# Patient Record
Sex: Female | Born: 1935 | Race: White | Hispanic: No
Health system: Southern US, Community
[De-identification: ages and names within clinical notes are randomized; demographics above are authoritative.]

---

## 2004-04-28 ENCOUNTER — Ambulatory Visit: Payer: Self-pay | Admitting: Internal Medicine

## 2005-05-14 ENCOUNTER — Ambulatory Visit: Payer: Self-pay | Admitting: Internal Medicine

## 2006-05-20 ENCOUNTER — Ambulatory Visit: Payer: Self-pay | Admitting: Internal Medicine

## 2006-07-12 ENCOUNTER — Ambulatory Visit: Payer: Self-pay | Admitting: Internal Medicine

## 2007-05-23 ENCOUNTER — Ambulatory Visit: Payer: Self-pay | Admitting: Internal Medicine

## 2008-02-10 ENCOUNTER — Ambulatory Visit: Payer: Self-pay | Admitting: Internal Medicine

## 2008-05-23 ENCOUNTER — Ambulatory Visit: Payer: Self-pay | Admitting: Internal Medicine

## 2010-09-19 ENCOUNTER — Ambulatory Visit: Payer: Self-pay | Admitting: Internal Medicine

## 2011-03-26 ENCOUNTER — Ambulatory Visit: Payer: Self-pay | Admitting: Otolaryngology

## 2011-04-08 ENCOUNTER — Ambulatory Visit: Payer: Self-pay | Admitting: Anesthesiology

## 2011-04-09 ENCOUNTER — Ambulatory Visit: Payer: Self-pay | Admitting: Otolaryngology

## 2011-04-28 ENCOUNTER — Ambulatory Visit: Payer: Self-pay | Admitting: Unknown Physician Specialty

## 2011-05-05 ENCOUNTER — Inpatient Hospital Stay: Payer: Self-pay | Admitting: Unknown Physician Specialty

## 2012-08-01 ENCOUNTER — Ambulatory Visit: Payer: Self-pay | Admitting: Family Medicine

## 2012-12-18 IMAGING — CT CT NECK WITHOUT AND WITH CONTRAST
2 of 3 series · 8 of 14 positions shown, 9 images · non-contrast
Comparison: none

REASON FOR EXAM: Attn ant floor of mouth Swelling under tongue
COMMENTS:

[Series 2: neck wo 3.0 3 · axial · 0.47mm/px · z∈[+405,+564]mm · 4 of 89 slices shown, 5 images]
[im 18/89  soft-tissue]
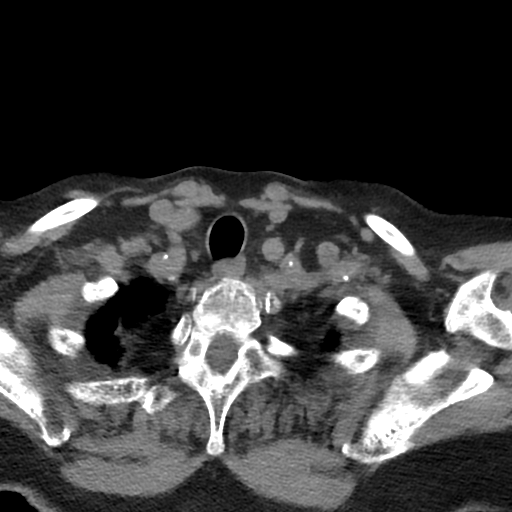
[im 18/89  bone]
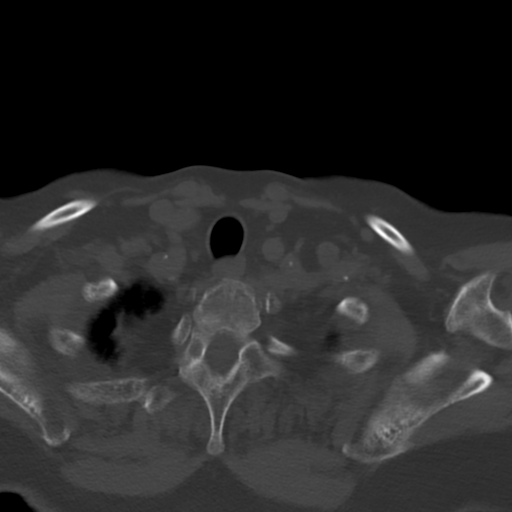
[im 36/89  bone]
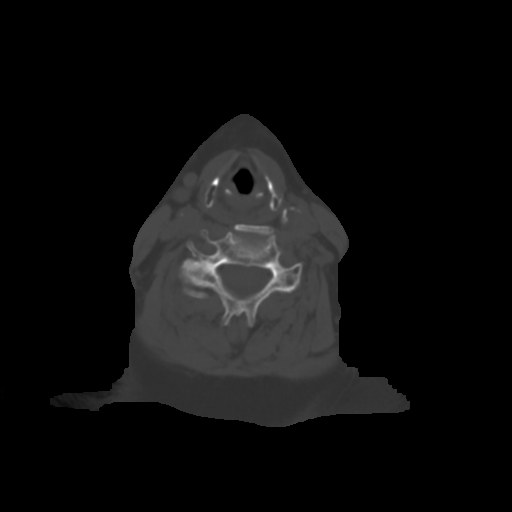
[im 53/89  bone]
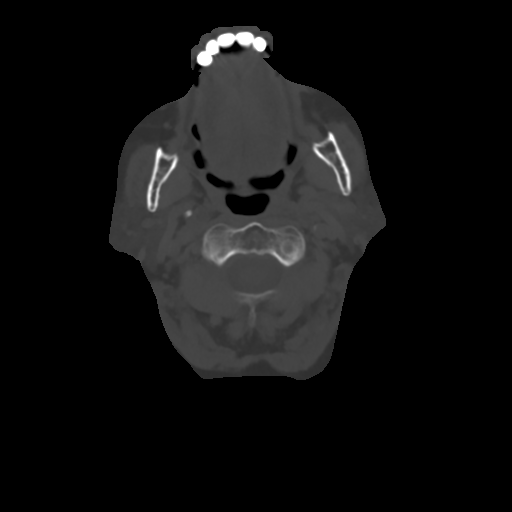
[im 71/89  bone]
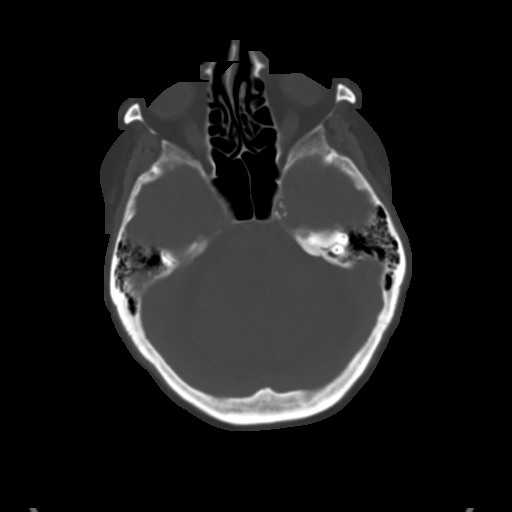

[Series 4: neck 3.0 3 · axial · 0.49mm/px · z∈[+404,+560]mm · 4 of 88 slices shown]
[im 18/88  bone]
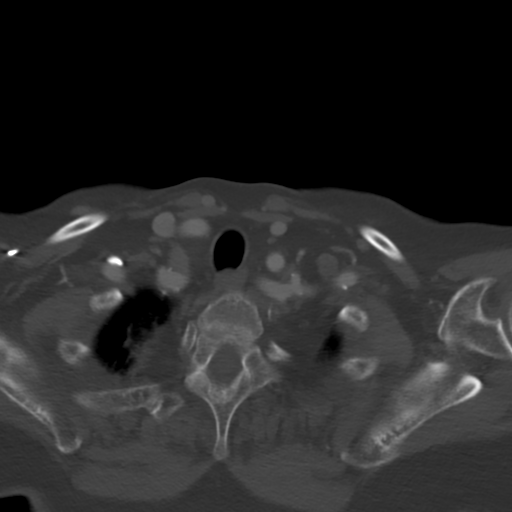
[im 35/88  bone]
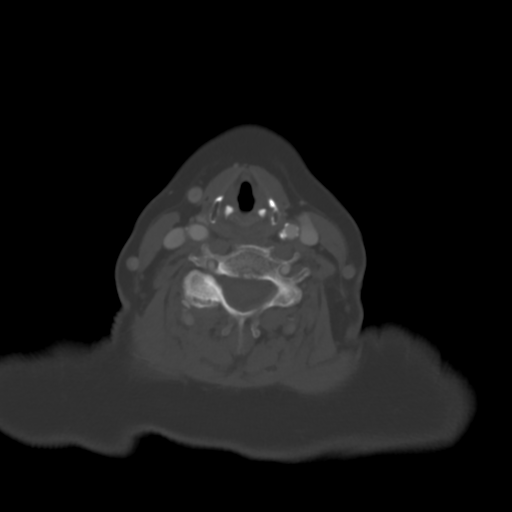
[im 53/88  bone]
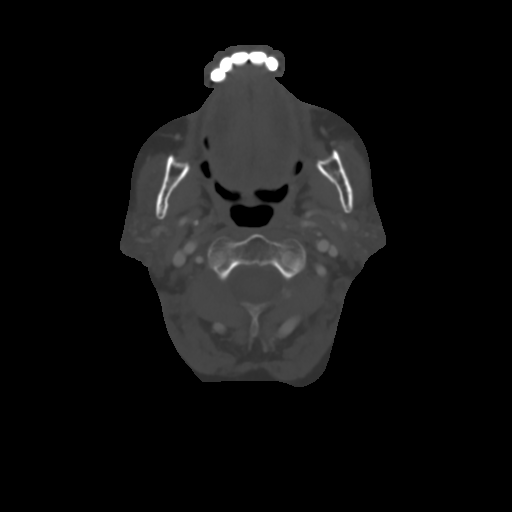
[im 70/88  bone]
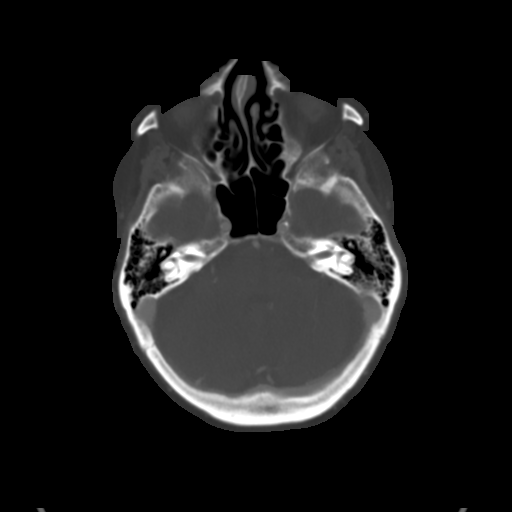

[8 of 14 positions shown; findings below may reference images not displayed]

PROCEDURE:     KCT - KCT NECK W/WO CONTRAST  - March 26, 2011  [DATE]

RESULT:     Multislice helical acquisition is obtained through the cervical
spine prior to and during injection of 75 mL of Zsovue-0CJ iodinated
intravenous contrast. Images are reconstructed at 3.0 mm slice thickness in
the axial plane. The patient has no previous exam for comparison.

Precontrast images do not show a definite salivary gland or duct stone.
There is prominent calcification extending superiorly from the lesser cornu
of the hyoid greater on the right than the left. There is also very
prominent atherosclerotic calcification bilaterally in the carotid arteries.
This is especially prominent in the internal carotid on image 42 on the
right. Correlate for carotid Doppler hemodynamically significant
atherosclerosis with carotid Doppler ultrasound.

Images obtained following iodinated contrast injection (75 mL Zsovue-0CJ)
demonstrate normal enhancement at of the base the brain. The orbits,
sinuses, mastoid and middle ear regions appear grossly normal. The
nasopharynx and oropharynx are unremarkable. The larynx shows no deformity
or asymmetry. The thyroid enhances homogeneously and symmetrically. The
salivary glands appear normal in size and enhancement. Atherosclerotic
calcification is present within the aortic arch. The lung apices demonstrate
minimal dependent atelectasis without a discrete mass.
IMPRESSION: No definite evidence of sialolithiasis. No evidence of abscess, cellulitis
or adenopathy.

## 2013-01-20 IMAGING — CR DG CHEST 2V
1 series · 2 of 2 positions shown · non-contrast
Comparison: none

REASON FOR EXAM: htn
COMMENTS:

[Series 1: view not recorded · 0.17mm/px · 2 of 2 slices shown]
[im 1/2]
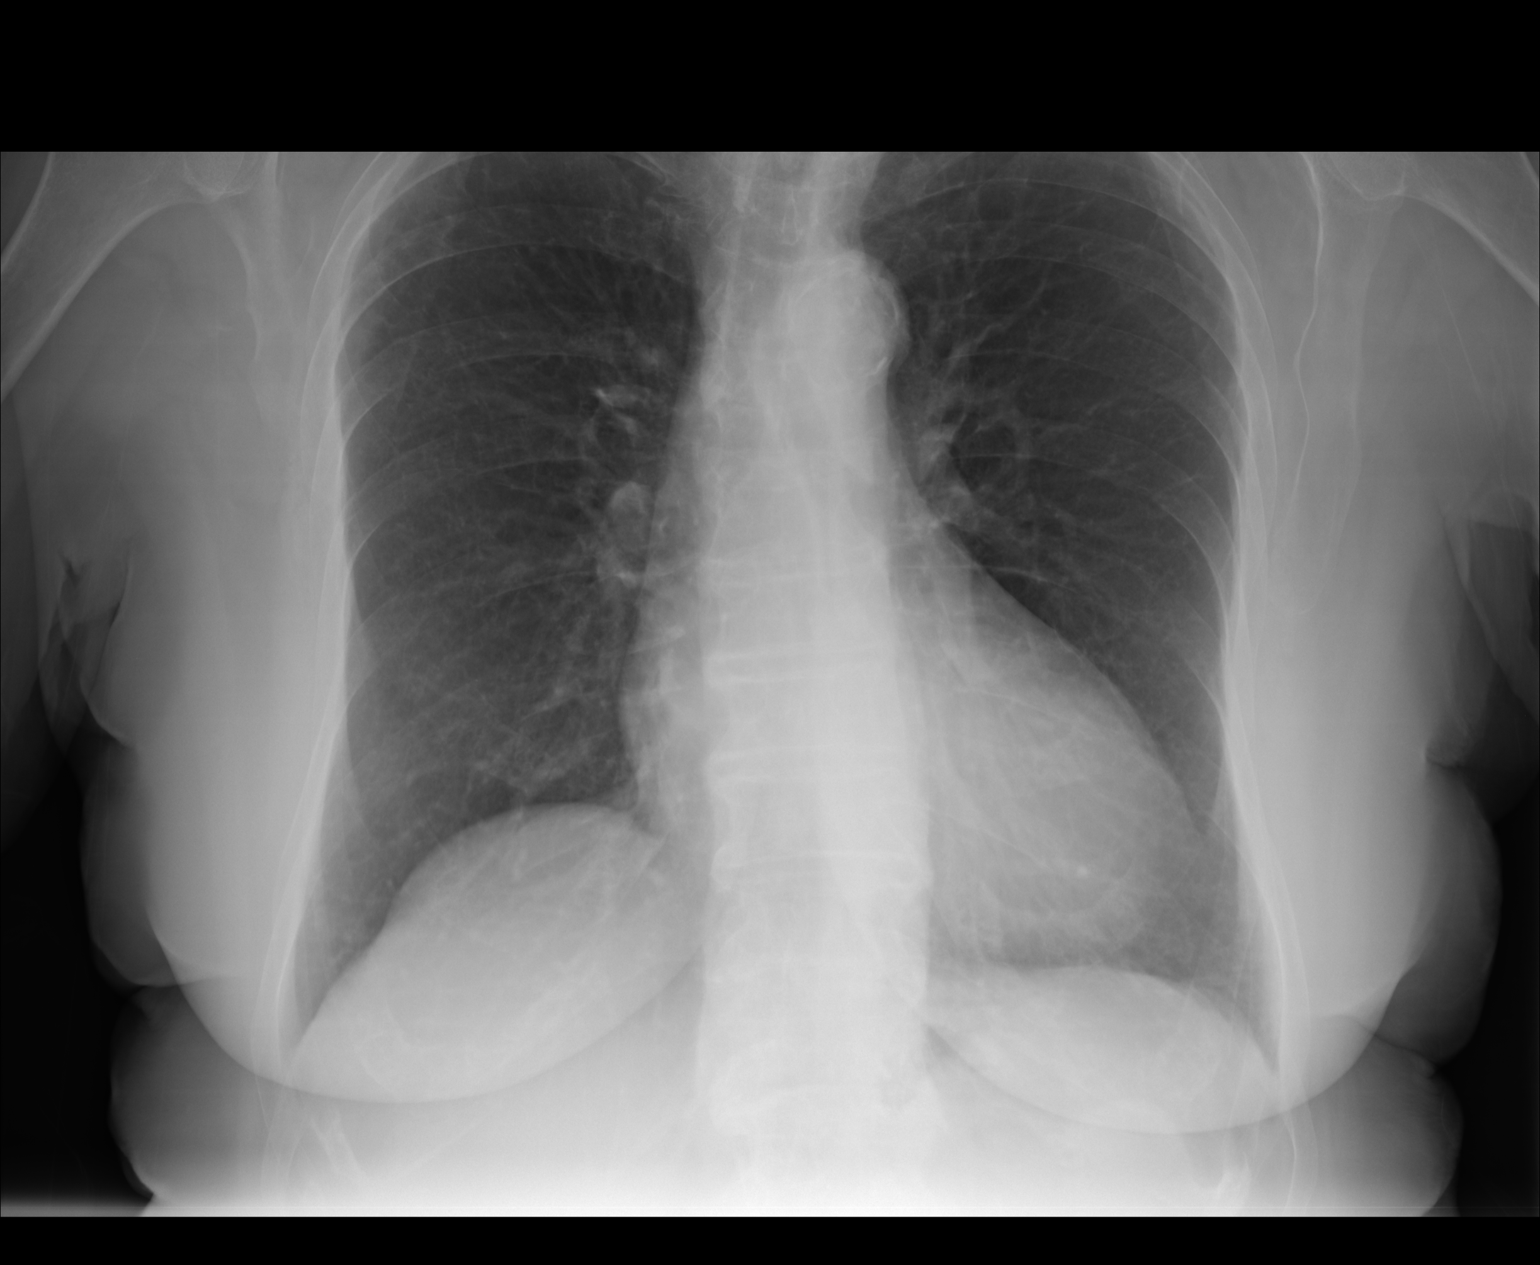
[im 2/2]
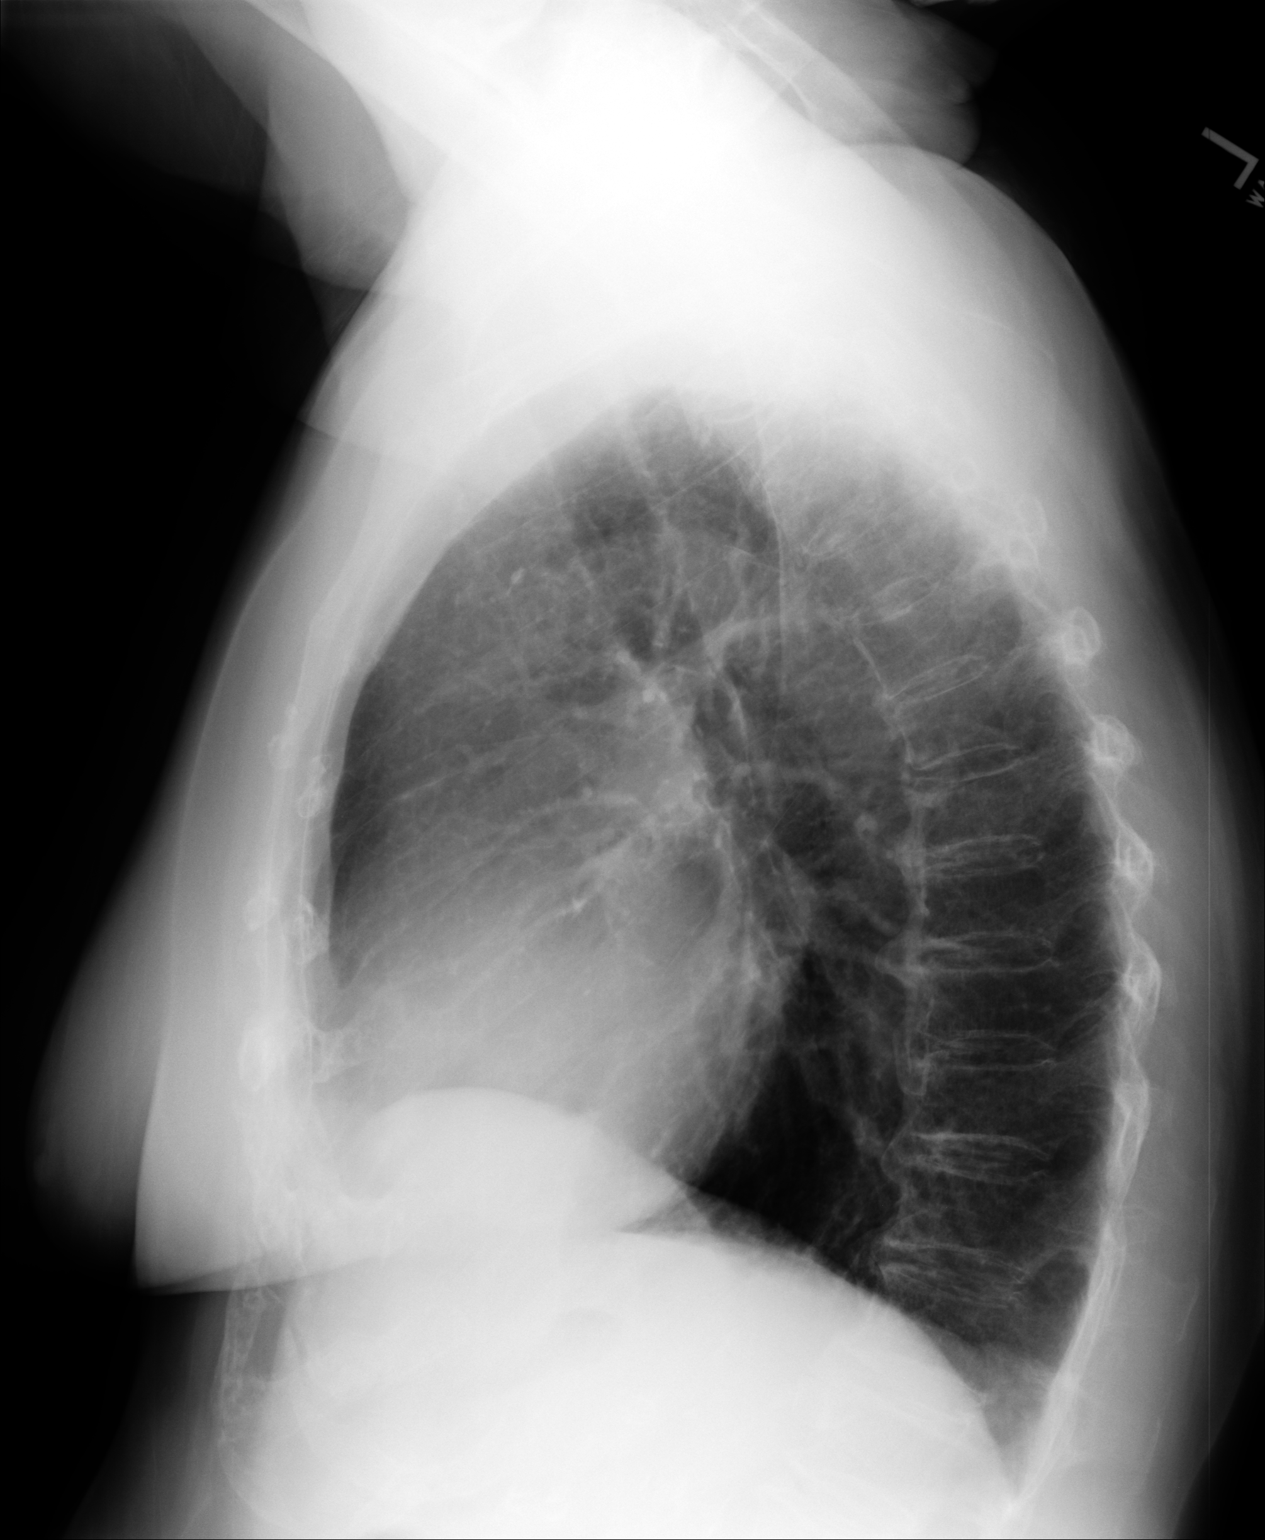

[2 of 2 positions shown; findings below may reference images not displayed]

PROCEDURE:     DXR - DXR CHEST PA (OR AP) AND LATERAL  - April 28, 2011 [DATE]

RESULT:     The lung fields are clear. No pneumonia, pneumothorax or pleural
effusion is seen. The heart is mildly enlarged. The mediastinal and osseous
structures show no acute changes. Calcification is observed in the anterior
spinal ligament at multiple levels.
IMPRESSION: 1. The lung fields are clear.
2. The heart appears mildly enlarged.
3. No acute bony abnormalities are seen.

## 2013-05-27 LAB — COMPREHENSIVE METABOLIC PANEL
Albumin: 3.6 g/dL (ref 3.4–5.0)
Alkaline Phosphatase: 75 U/L (ref 50–136)
Anion Gap: 5 — ABNORMAL LOW (ref 7–16)
BUN: 18 mg/dL (ref 7–18)
Bilirubin,Total: 0.6 mg/dL (ref 0.2–1.0)
Co2: 25 mmol/L (ref 21–32)
Creatinine: 0.98 mg/dL (ref 0.60–1.30)
EGFR (African American): >60
EGFR (Non-African Amer.): 52 — ABNORMAL LOW
Osmolality: 278 (ref 275–301)
Potassium: 3.9 mmol/L (ref 3.5–5.1)
SGOT(AST): 23 U/L (ref 15–37)
Sodium: 138 mmol/L (ref 136–145)

## 2013-05-27 LAB — CBC
HCT: 38.8 % (ref 35.0–47.0)
HGB: 13.1 g/dL (ref 12.0–16.0)
MCH: 32.9 pg (ref 26.0–34.0)
MCV: 97 fL (ref 80–100)
Platelet: 186 10*3/uL (ref 150–440)
RBC: 4 10*6/uL (ref 3.80–5.20)
RDW: 15.3 % — ABNORMAL HIGH (ref 11.5–14.5)

## 2013-05-28 ENCOUNTER — Observation Stay: Payer: Self-pay | Admitting: Internal Medicine

## 2013-05-28 LAB — CBC WITH DIFFERENTIAL/PLATELET
Basophil #: 0 10*3/uL (ref 0.0–0.1)
Basophil #: 0.1 10*3/uL (ref 0.0–0.1)
Basophil %: 0.4 %
Basophil %: 0.8 %
Eosinophil #: 0 10*3/uL (ref 0.0–0.7)
Eosinophil #: 0.1 10*3/uL (ref 0.0–0.7)
Eosinophil %: 0.5 %
HGB: 10.3 g/dL — ABNORMAL LOW (ref 12.0–16.0)
HGB: 12.7 g/dL (ref 12.0–16.0)
Lymphocyte #: 1.6 10*3/uL (ref 1.0–3.6)
Lymphocyte #: 2.2 10*3/uL (ref 1.0–3.6)
Lymphocyte %: 15.7 %
MCH: 32.7 pg (ref 26.0–34.0)
MCHC: 33.7 g/dL (ref 32.0–36.0)
MCHC: 34.8 g/dL (ref 32.0–36.0)
MCV: 97 fL (ref 80–100)
Monocyte %: 9.5 %
Neutrophil %: 70.5 %
RBC: 3.09 10*6/uL — ABNORMAL LOW (ref 3.80–5.20)
RBC: 3.89 10*6/uL (ref 3.80–5.20)
RDW: 15.6 % — ABNORMAL HIGH (ref 11.5–14.5)
WBC: 9.9 10*3/uL (ref 3.6–11.0)

## 2013-05-28 LAB — BASIC METABOLIC PANEL
Anion Gap: 4 — ABNORMAL LOW (ref 7–16)
BUN: 37 mg/dL — ABNORMAL HIGH (ref 7–18)
Calcium, Total: 8.4 mg/dL — ABNORMAL LOW (ref 8.5–10.1)
Chloride: 108 mmol/L — ABNORMAL HIGH (ref 98–107)
Creatinine: 0.79 mg/dL (ref 0.60–1.30)
Glucose: 100 mg/dL — ABNORMAL HIGH (ref 65–99)
Osmolality: 286 (ref 275–301)
Potassium: 3.6 mmol/L (ref 3.5–5.1)

## 2013-05-28 LAB — PROTIME-INR
INR: 1.2
Prothrombin Time: 15.6 secs — ABNORMAL HIGH (ref 11.5–14.7)

## 2013-05-28 LAB — APTT: Activated PTT: 26 secs (ref 23.6–35.9)

## 2013-09-07 ENCOUNTER — Inpatient Hospital Stay: Payer: Self-pay | Admitting: Internal Medicine

## 2013-09-07 ENCOUNTER — Ambulatory Visit: Payer: Self-pay

## 2013-09-07 LAB — URINALYSIS, COMPLETE
Bilirubin,UR: NEGATIVE
Glucose,UR: NEGATIVE mg/dL (ref 0–75)
Ketone: NEGATIVE
Nitrite: POSITIVE
PROTEIN: NEGATIVE
Ph: 5 (ref 4.5–8.0)
RBC,UR: 6 /HPF (ref 0–5)
Specific Gravity: 1.013 (ref 1.003–1.030)

## 2013-09-07 LAB — CBC WITH DIFFERENTIAL/PLATELET
BASOS ABS: 0.1 10*3/uL (ref 0.0–0.1)
Basophil %: 0.7 %
Eosinophil #: 0.1 10*3/uL (ref 0.0–0.7)
Eosinophil %: 0.9 %
HCT: 32 % — AB (ref 35.0–47.0)
HGB: 10.1 g/dL — ABNORMAL LOW (ref 12.0–16.0)
LYMPHS ABS: 1.2 10*3/uL (ref 1.0–3.6)
Lymphocyte %: 14.6 %
MCH: 27.5 pg (ref 26.0–34.0)
MCHC: 31.7 g/dL — ABNORMAL LOW (ref 32.0–36.0)
MCV: 87 fL (ref 80–100)
Monocyte #: 0.8 x10 3/mm (ref 0.2–0.9)
Monocyte %: 9.8 %
NEUTROS PCT: 74 %
Neutrophil #: 5.9 10*3/uL (ref 1.4–6.5)
Platelet: 218 10*3/uL (ref 150–440)
RBC: 3.69 10*6/uL — ABNORMAL LOW (ref 3.80–5.20)
RDW: 18 % — ABNORMAL HIGH (ref 11.5–14.5)
WBC: 7.9 10*3/uL (ref 3.6–11.0)

## 2013-09-07 LAB — PROTIME-INR
INR: 1.3
Prothrombin Time: 15.5 secs — ABNORMAL HIGH (ref 11.5–14.7)

## 2013-09-07 LAB — COMPREHENSIVE METABOLIC PANEL
ALT: 16 U/L (ref 12–78)
AST: 16 U/L (ref 15–37)
Albumin: 3.6 g/dL (ref 3.4–5.0)
Alkaline Phosphatase: 81 U/L
Anion Gap: 7 (ref 7–16)
BUN: 28 mg/dL — ABNORMAL HIGH (ref 7–18)
Bilirubin,Total: 0.6 mg/dL (ref 0.2–1.0)
CHLORIDE: 107 mmol/L (ref 98–107)
CREATININE: 1.06 mg/dL (ref 0.60–1.30)
Calcium, Total: 8.6 mg/dL (ref 8.5–10.1)
Co2: 23 mmol/L (ref 21–32)
EGFR (African American): 55 — ABNORMAL LOW
EGFR (Non-African Amer.): 47 — ABNORMAL LOW
Glucose: 96 mg/dL (ref 65–99)
Osmolality: 279 (ref 275–301)
POTASSIUM: 4.3 mmol/L (ref 3.5–5.1)
SODIUM: 137 mmol/L (ref 136–145)
TOTAL PROTEIN: 7.1 g/dL (ref 6.4–8.2)

## 2013-09-07 LAB — MAGNESIUM: Magnesium: 1.9 mg/dL

## 2013-09-07 LAB — LIPASE, BLOOD: LIPASE: 157 U/L (ref 73–393)

## 2013-09-07 LAB — TROPONIN I: Troponin-I: <0.02 ng/mL

## 2013-09-07 LAB — APTT: Activated PTT: 31.1 secs (ref 23.6–35.9)

## 2013-09-07 LAB — PRO B NATRIURETIC PEPTIDE: B-Type Natriuretic Peptide: 6294 pg/mL — ABNORMAL HIGH (ref 0–450)

## 2013-09-08 LAB — CLOSTRIDIUM DIFFICILE(ARMC)

## 2013-09-09 LAB — CBC WITH DIFFERENTIAL/PLATELET
BASOS PCT: 0.7 %
Basophil #: 0 10*3/uL (ref 0.0–0.1)
EOS PCT: 2.1 %
Eosinophil #: 0.1 10*3/uL (ref 0.0–0.7)
HCT: 30.2 % — ABNORMAL LOW (ref 35.0–47.0)
HGB: 9.7 g/dL — AB (ref 12.0–16.0)
Lymphocyte #: 1.2 10*3/uL (ref 1.0–3.6)
Lymphocyte %: 18.8 %
MCH: 27.6 pg (ref 26.0–34.0)
MCHC: 32.2 g/dL (ref 32.0–36.0)
MCV: 86 fL (ref 80–100)
MONOS PCT: 15.1 %
Monocyte #: 1 x10 3/mm — ABNORMAL HIGH (ref 0.2–0.9)
Neutrophil #: 4.1 10*3/uL (ref 1.4–6.5)
Neutrophil %: 63.3 %
Platelet: 200 10*3/uL (ref 150–440)
RBC: 3.52 10*6/uL — ABNORMAL LOW (ref 3.80–5.20)
RDW: 18.2 % — ABNORMAL HIGH (ref 11.5–14.5)
WBC: 6.5 10*3/uL (ref 3.6–11.0)

## 2013-09-09 LAB — COMPREHENSIVE METABOLIC PANEL
ALBUMIN: 3 g/dL — AB (ref 3.4–5.0)
ALK PHOS: 65 U/L
Anion Gap: 5 — ABNORMAL LOW (ref 7–16)
BUN: 20 mg/dL — AB (ref 7–18)
Bilirubin,Total: 0.6 mg/dL (ref 0.2–1.0)
CALCIUM: 8.5 mg/dL (ref 8.5–10.1)
Chloride: 108 mmol/L — ABNORMAL HIGH (ref 98–107)
Co2: 24 mmol/L (ref 21–32)
Creatinine: 0.93 mg/dL (ref 0.60–1.30)
EGFR (African American): >60
EGFR (Non-African Amer.): 55 — ABNORMAL LOW
Glucose: 98 mg/dL (ref 65–99)
OSMOLALITY: 276 (ref 275–301)
POTASSIUM: 3.5 mmol/L (ref 3.5–5.1)
SGOT(AST): 18 U/L (ref 15–37)
SGPT (ALT): 13 U/L (ref 12–78)
Sodium: 137 mmol/L (ref 136–145)
Total Protein: 5.9 g/dL — ABNORMAL LOW (ref 6.4–8.2)

## 2013-09-10 LAB — URINE CULTURE

## 2013-09-11 LAB — BASIC METABOLIC PANEL
ANION GAP: 7 (ref 7–16)
BUN: 19 mg/dL — AB (ref 7–18)
CHLORIDE: 104 mmol/L (ref 98–107)
CO2: 26 mmol/L (ref 21–32)
Calcium, Total: 8.5 mg/dL (ref 8.5–10.1)
Creatinine: 1.29 mg/dL (ref 0.60–1.30)
GFR CALC AF AMER: 43 — AB
GFR CALC NON AF AMER: 37 — AB
Glucose: 98 mg/dL (ref 65–99)
Osmolality: 276 (ref 275–301)
Potassium: 3.2 mmol/L — ABNORMAL LOW (ref 3.5–5.1)
SODIUM: 137 mmol/L (ref 136–145)

## 2013-09-12 LAB — CULTURE, BLOOD (SINGLE)

## 2013-09-28 ENCOUNTER — Ambulatory Visit: Payer: Self-pay | Admitting: Family Medicine

## 2014-03-27 ENCOUNTER — Ambulatory Visit: Payer: Self-pay | Admitting: Internal Medicine

## 2014-04-16 ENCOUNTER — Ambulatory Visit: Payer: Self-pay | Admitting: Neurology

## 2014-04-16 ENCOUNTER — Inpatient Hospital Stay: Payer: Self-pay | Admitting: Internal Medicine

## 2014-04-16 LAB — COMPREHENSIVE METABOLIC PANEL
ALK PHOS: 101 U/L
AST: 31 U/L (ref 15–37)
Albumin: 2.4 g/dL — ABNORMAL LOW (ref 3.4–5.0)
Anion Gap: 9 (ref 7–16)
BUN: 14 mg/dL (ref 7–18)
Bilirubin,Total: 0.6 mg/dL (ref 0.2–1.0)
CALCIUM: 7.6 mg/dL — AB (ref 8.5–10.1)
CHLORIDE: 110 mmol/L — AB (ref 98–107)
CO2: 20 mmol/L — AB (ref 21–32)
Creatinine: 0.56 mg/dL — ABNORMAL LOW (ref 0.60–1.30)
EGFR (African American): >60
EGFR (Non-African Amer.): >60
GLUCOSE: 93 mg/dL (ref 65–99)
OSMOLALITY: 278 (ref 275–301)
Potassium: 3.5 mmol/L (ref 3.5–5.1)
SGPT (ALT): 16 U/L
Sodium: 139 mmol/L (ref 136–145)
Total Protein: 5.8 g/dL — ABNORMAL LOW (ref 6.4–8.2)

## 2014-04-16 LAB — URINALYSIS, COMPLETE
BILIRUBIN, UR: NEGATIVE
GLUCOSE, UR: NEGATIVE mg/dL (ref 0–75)
KETONE: NEGATIVE
Nitrite: NEGATIVE
Ph: 8 (ref 4.5–8.0)
RBC,UR: >30 /HPF (ref 0–5)
Specific Gravity: 1.005 (ref 1.003–1.030)
WBC UR: >30 /HPF (ref 0–5)

## 2014-04-16 LAB — CBC
HCT: 37.2 % (ref 35.0–47.0)
HGB: 11.6 g/dL — ABNORMAL LOW (ref 12.0–16.0)
MCH: 27.9 pg (ref 26.0–34.0)
MCHC: 31.2 g/dL — AB (ref 32.0–36.0)
MCV: 89 fL (ref 80–100)
Platelet: 310 10*3/uL (ref 150–440)
RBC: 4.16 10*6/uL (ref 3.80–5.20)
RDW: 22.3 % — ABNORMAL HIGH (ref 11.5–14.5)
WBC: 16.5 10*3/uL — ABNORMAL HIGH (ref 3.6–11.0)

## 2014-04-16 LAB — CK TOTAL AND CKMB (NOT AT ARMC)
CK, Total: 31 U/L
CK-MB: 1.2 ng/mL (ref 0.5–3.6)

## 2014-04-16 LAB — TROPONIN I: TROPONIN-I: 0.06 ng/mL — AB

## 2014-04-17 LAB — CK-MB
CK-MB: 1.6 ng/mL (ref 0.5–3.6)
CK-MB: 1.9 ng/mL (ref 0.5–3.6)

## 2014-04-17 LAB — TROPONIN I
TROPONIN-I: 0.08 ng/mL — AB
TROPONIN-I: 0.12 ng/mL — AB

## 2014-04-18 LAB — CBC WITH DIFFERENTIAL/PLATELET
BASOS PCT: 0.5 %
Basophil #: 0 10*3/uL (ref 0.0–0.1)
Eosinophil #: 0.3 10*3/uL (ref 0.0–0.7)
Eosinophil %: 3.3 %
HCT: 35.1 % (ref 35.0–47.0)
HGB: 11.4 g/dL — AB (ref 12.0–16.0)
LYMPHS PCT: 20.3 %
Lymphocyte #: 1.7 10*3/uL (ref 1.0–3.6)
MCH: 29.1 pg (ref 26.0–34.0)
MCHC: 32.4 g/dL (ref 32.0–36.0)
MCV: 90 fL (ref 80–100)
MONO ABS: 0.9 x10 3/mm (ref 0.2–0.9)
MONOS PCT: 11.1 %
Neutrophil #: 5.4 10*3/uL (ref 1.4–6.5)
Neutrophil %: 64.8 %
Platelet: 209 10*3/uL (ref 150–440)
RBC: 3.91 10*6/uL (ref 3.80–5.20)
RDW: 22.1 % — ABNORMAL HIGH (ref 11.5–14.5)
WBC: 8.4 10*3/uL (ref 3.6–11.0)

## 2014-04-18 LAB — BASIC METABOLIC PANEL
ANION GAP: 10 (ref 7–16)
BUN: 6 mg/dL — ABNORMAL LOW (ref 7–18)
CALCIUM: 7.7 mg/dL — AB (ref 8.5–10.1)
CREATININE: 0.56 mg/dL — AB (ref 0.60–1.30)
Chloride: 104 mmol/L (ref 98–107)
Co2: 28 mmol/L (ref 21–32)
EGFR (Non-African Amer.): >60
GLUCOSE: 61 mg/dL — AB (ref 65–99)
Osmolality: 279 (ref 275–301)
Potassium: 3.3 mmol/L — ABNORMAL LOW (ref 3.5–5.1)
Sodium: 142 mmol/L (ref 136–145)

## 2014-04-19 LAB — URINE CULTURE

## 2014-04-20 LAB — BASIC METABOLIC PANEL
Anion Gap: 6 — ABNORMAL LOW (ref 7–16)
BUN: 12 mg/dL (ref 7–18)
CHLORIDE: 110 mmol/L — AB (ref 98–107)
CO2: 29 mmol/L (ref 21–32)
CREATININE: 0.6 mg/dL (ref 0.60–1.30)
Calcium, Total: 8.2 mg/dL — ABNORMAL LOW (ref 8.5–10.1)
EGFR (African American): >60
EGFR (Non-African Amer.): >60
Glucose: 110 mg/dL — ABNORMAL HIGH (ref 65–99)
OSMOLALITY: 289 (ref 275–301)
POTASSIUM: 3.4 mmol/L — AB (ref 3.5–5.1)
SODIUM: 145 mmol/L (ref 136–145)

## 2014-04-20 LAB — PLATELET COUNT: Platelet: 221 10*3/uL (ref 150–440)

## 2014-04-20 LAB — MAGNESIUM: Magnesium: 1.4 mg/dL — ABNORMAL LOW

## 2014-04-21 LAB — CULTURE, BLOOD (SINGLE)

## 2014-04-26 ENCOUNTER — Ambulatory Visit: Payer: Self-pay | Admitting: Internal Medicine

## 2014-04-26 DEATH — deceased

## 2014-11-16 NOTE — Consult Note (Signed)
PATIENT NAME:  Erika Snow, [name] MR#:  [id number] DATE OF BIRTH:  02/11/1924  DATE OF CONSULTATION:  05/27/2013  REFERRING PHYSICIAN:   CONSULTING PHYSICIAN:  Davina Pokehapman T. Dorna Mallet, MD  ATTENDING PHYSICIAN:  Dr. Cyril LoosenKinner in the Emergency Room.   REASON FOR CONSULTATION:  Epistaxis.   HISTORY OF PRESENT ILLNESS:  This is an 81 year old female who presented to the Emergency Room with approximately three hours of bleeding.  According to Ms. Fanfan and her family the bleeding originally began on the right-hand side.  When she came to the Emergency Room the bleeding appeared to be most from the left-hand side.  A Rhino Rocket was placed on that side, followed by a Merocel sponge on the right.  She has continued to ooze.  She has had intermittent epistaxis in the past.  She is taking Xarelto and was taking a baby aspirin at some point in the past, but they are unaware of any recent aspirin.  Her hemoglobin on admission was 13.1.   PRIMARY CARE PHYSICIAN:  Dr. Quillian QuinceBliss.   PAST MEDICAL HISTORY:  Significant for atrial fibrillation, hypercholesterolemia, hypertension.   PAST SURGICAL HISTORY:  She has had a total knee.   REVIEW OF SYSTEMS:  Positive only for a mild sore throat and bleeding from the nose.   MEDICATIONS:  Include zinc, Xarelto, vitamin C, Tylenol, Tramadol, Symbicort, Norco, metoprolol, losartan, fish oil, diltiazem, calcium citrate.    Her blood pressure was also elevated in the Emergency Room and was currently being treated.   PHYSICAL EXAMINATION:   EARS:  On exam today the external ears appeared normal.  NOSE:  The anterior nose, she has a Rhino Rocket on the left and Merocel not fully inserted on the right.   MOUTH:  The oral cavity and oropharynx, there is no active bleeding there.   NECK:  Palpation of the neck is benign.   I had a long discussion with family today.  I think her biggest risk factors for bleeding are her blood pressure and the Xarelto.  I have told them there is  really no way to correct the Xarelto at present, but what I would recommend is that we replace the Merocel and the Rhino Rocket with two anterior posterior Merocel sponges.  They are in agreement with this.  Therefore, after her consent, the Rhino Rocket and the Merocel were removed.  A cotton ball with phenylephrine lidocaine solution was placed within each nostril.  This was then replaced with a large one.  After approximately eight minutes this was removed.  Beginning on the right hand side an anterior posterior total Merocel sponge was impregnated with bacitracin ointment.  This was then placed completely within the right nostril in a similar fashion with the cottonoid pledgets removed.  The Merocel sponge was placed fully on the left.  These were then impregnated with a solution of Afrin and phenylephrine.  We then had the patient remain in the Emergency Room for quite some time.  There was minimal oozing from the right-hand side, but no posterior bleeding.   IMPRESSION:  Severe epistaxis, related to her hypertension and the Xarelto.  I have told the family from an ENT standpoint there is not much more that we can do.  I have spoken with Dr. Cyril LoosenKinner about having the hospitalist admit her overnight to continue to keep her blood pressure down and keep her head elevated.  Continue application of ice to the dorsum of the nose.  I will schedule her for a  follow-up appointment with my office in approximately five days.  I have also recommended that she be started on some Keflex 500 mg by mouth twice a day for the five days while the sponge is in place.  She does have a PENICILLIN ALLERGY, but I think the Keflex should be fine.     ____________________________ Davina Poke, MD ctm:ea D: 05/27/2013 23:29:27 ET T: 05/28/2013 00:41:41 ET JOB#: [id number]  cc: Davina Poke, MD, <Dictator> Davina Poke MD ELECTRONICALLY SIGNED 06/12/2013 7:58

## 2014-11-16 NOTE — H&P (Signed)
PATIENT NAME:  Erika Snow, Erika Snow MR#:  [id number] DATE OF BIRTH:  [birth year]  DATE OF ADMISSION:  05/27/2013  PRIMARY CARE PHYSICIAN: Dr. Aram BeechamJeffrey Sparks.   REFERRING PHYSICIAN: Dr. Jene Everyobert Kinner.   CHIEF COMPLAINT: Bleeding through nose.  HISTORY OF PRESENT ILLNESS: The patient is an  81 year old pleasant white female with past medical history of hypertension, atrial fibrillation, on Xarelto, who comes to the Emergency Department with bleeding through the nose since 3 p.m. Has been having profuse bleeding since then. In the Emergency Department, the patient was seen by Dr. Jenne CampusMcQueen, applied Afrin as well as packed the nose. The patient continues to have some oozing of the blood as well as the patient is extremely anxious. Concerning this, the decision is made to admit the patient and observe the patient. In the Emergency Department, the patient is found to have a hemoglobin of 13.1.   PAST MEDICAL HISTORY:  1.  Atrial fibrillation.  2.  Hypertension.  3.  Hyperlipidemia.   PAST SURGICAL HISTORY: Right knee replacement.   ALLERGIES: PENICILLIN.   HOME MEDICATIONS:  1.  Zinc 50 mg orally. 2.  Xarelto 15 mg once a day.  3.  Vitamin C 500 mg daily.  4.  Tylenol for sleep.  5.  Tramadol 50 mg every six hours as needed.  6.  Symbicort inhalation.  7.  Norco 5/325 mg orally 3 times a day as needed.  8.  Toprol-XL 100 mcg once a day.  9.  Losartan 50 mg once a day. 10.  Fish oil 1000 mg orally.  11.  Diltiazem 180 mg once a day.  12.  Calcium citrate orally.   SOCIAL HISTORY: He. No history of smoking, drinking alcohol or using illicit drugs. Has history of secondhand smoke exposure. Lives at home with her daughter.   FAMILY HISTORY: History of hypertension.   REVIEW OF SYSTEMS: Could not be obtained, as the patient is extremely anxious secondary to the bleeding and unable to breathe through nose.   PHYSICAL EXAMINATION:  GENERAL: This is a well-built, well-nourished, age-appropriate  female lying down in the bed, not in distress.  VITAL SIGNS: Temperature 97.8, pulse 80, blood pressure 150/86, respiratory rate of 20, oxygen saturations 96% on room air.  HEENT: Head normocephalic, atraumatic. Eyes: No sclerae icterus. Conjunctivae normal. Pupils equal and react to light. Extraocular movements are intact. Nose:  Both nostrils have been packed, but continues to have some oozing requiring gauze change after five minutes. Mucous membranes: Mild dryness. No pallor or erythema.  NECK: Supple. No lymphadenopathy. No JVD. No carotid bruit.  CHEST: No focal tenderness.  LUNGS: Bilaterally clear to auscultation.  HEART: S1, S2 regular. No murmurs are heard. No pedal edema. Pulses 2+.  ABDOMEN: Bowel sounds present. Soft, nontender, nondistended. No hepatosplenomegaly.  NEUROLOGIC: The patient is alert, oriented to place, person and time. The patient is extremely anxious. Moving all four extremities, 5/5 in upper and lower extremities.  SKIN:  No rash or lesions.  MUSCULOSKELETAL: Good range of motion in all the extremities.   LABS: Complete metabolic panel is completely within normal limits. CBC: WBC of 9.8, hemoglobin 13.1. The rest of all the values are within normal limits.   ASSESSMENT AND PLAN: The patient is an 81 year old female who comes to the Emergency Department with epistaxis.  1.  Epistaxis.  The patient is on Xarelto, has been seen by Dr. Jenne CampusMcQueen, ENT, and has  nasal packing. The patient continues to have some oozing. Continue to check CBC  q.8 hours. Will also keep her on prophylactic antibiotics with Rocephin. 2.  Hypertension. Continue with the home medications.  3.  Atrial fibrillation. Rate is currently well controlled.  Will hold the Xarelto. 4.  Keep the patient on deep vein thrombosis prophylaxis with sequential compression devices.   TIME SPENT: 45 minutes.   ____________________________ Susa Griffins, MD pv:cg D: 05/28/2013 00:27:39 ET T: 05/28/2013  00:25:42 ET JOB#: [id number]  cc: Susa Griffins, MD, <Dictator> Duane Lope. Judithann Sheen, MD Susa Griffins MD ELECTRONICALLY SIGNED 06/13/2013 0:42

## 2014-11-16 NOTE — Discharge Summary (Signed)
PATIENT NAME:  Erika Snow, Erika Snow MR#:  [id number] DATE OF BIRTH:  April 30, 1924  DATE OF ADMISSION:  05/28/2013 DATE OF DISCHARGE:  05/29/2013  PRESENTING COMPLAINT: Epistaxis.  DISCHARGE DIAGNOSES:  1.  Bilateral epistaxis, status post nasal packing, improved. 2.  Uncontrolled hypertension. 3.  Atrial fibrillation, on chronic anticoagulation with Xarelto, which is discontinued now.  PROCEDURES: Bilateral nasal packing by Dr. Linus Salmonshapman McQueen.  CODE STATUS: Full code.  MEDICATIONS:  1.  Diltiazem extended-release 180 mg p.o. daily. 2.  Losartan 50 mg daily. 3.  Norco 5/325, 1 tablet 3 times a day as needed. 4.  Tramadol 50 mg q.6 hourly. 5.  Symbicort 160/4.5 mcg per inhalation daily Snow.i.d.  6.  Zinc 50 mg daily. 7.  Fish oil 1000 mg p.o. daily. 8.  Tylenol sleep aid as needed. 9.  Vitamin C 500 mg daily. 10.  Calcium citrate with vitamin D3 p.o. daily. 11.  Metoprolol 50 mg Snow.i.d.  12.  Hydralazine 25 mg t.i.d.  13.  Keflex 500 mg every 8 hours.  FOLLOWUP:   1.  With Dr. Willeen CassBennett, [facility], on November 6 at 2:00 p.m.  2.  With Dr. Juel BurrowMasoud in 1 to 2 weeks.  LABORATORY DATA:  Hemoglobin and hematocrit are 10.3 and 29.8; platelet count is 160; white count is 9.9. Basic metabolic panel within normal limits.  PT-INR of 15.6 and 1.2.   CONSULTATIONS: ENT, Dr. Linus Salmonshapman McQueen.  BRIEF SUMMARY OF HOSPITAL COURSE: [name] is an 81 year old Caucasian female with history of chronic A. fib, on Xarelto, and history of hypertension which lately has been remaining uncontrolled, comes to the Emergency Room with: 1.  Bilateral epistaxis: Likely in the setting of uncontrolled hypertension and Xarelto use for anticoagulation due to chronic A. fib. The patient was seen by Dr. Jenne CampusMcQueen, ENT, in the Emergency Room. Her nose was packed.  She is on p.o. prophylactic Keflex. She will see Dr. Willeen CassBennett on Thursday, November 6, for removal of packing and further followup. Her blood pressure is much  better under control. 2.  Uncontrolled hypertension: Resumed home meds. Added hydralazine to home medication list. 3.  Chronic A. fib: Rate is currently well-controlled. Xarelto has been discontinued. The patient and her family understand risk of CVA.   Physical therapy recommends home PT, which has been arranged.  Hospital stay otherwise remained stable. The patient remained a full code.  TIME SPENT: 40 minutes.  ____________________________ Wylie HailSona A. Allena KatzPatel, MD sap:jm D: 05/29/2013 14:57:26 ET T: 05/29/2013 16:13:56 ET JOB#: [id number]  cc: Rilynn Habel A. Allena KatzPatel, MD, <Dictator> Ollen GrossPaul S. Willeen CassBennett, MD Davina Pokehapman T. McQueen, MD Corky DownsJaved Masoud, MD Willow OraSONA A Maloni Musleh MD ELECTRONICALLY SIGNED 06/01/2013 13:31

## 2014-11-17 NOTE — H&P (Signed)
PATIENT NAME:  Erika Snow, Erika Snow MR#:  [id number] DATE OF BIRTH:  December 31, 1923  DATE OF ADMISSION:  09/07/2013  REFERRING PHYSICIAN: Dr. York CeriseForbach.    PRIMARY CARE PHYSICIAN: Dr. Quillian QuinceBliss.   CHIEF COMPLAINT: Diarrhea.   HISTORY OF PRESENT ILLNESS: An 81 year old Caucasian female with past medical history of atrial fibrillation, hypertension, hyperlipidemia. Presenting with diarrhea. She is an extremely poor historian. With the assistance of family at bedside, history obtained. States the diarrhea has been approximately 6 days in total. Multiple episodes of profound diarrhea described as watery. No recent infections or antibiotic usage. Denies any nausea, vomiting, fevers, chills. Denies any abdominal pain or known sick contacts. [facility] in which the history obtained is slightly different. Complaining of nausea, vomiting and diarrhea starting approximately 6 days ago with copious amounts of diarrhea for 2 days in total duration, with associated generalized weakness. Also per the family, the patient has been complaining of shortness of breath for a total of 1 month in duration; however, progressively worsened over the last 1 week. Mainly describes it as dyspnea on exertion with profound generalized weakness. Noted occasional nonproductive cough. Denies any orthopnea or lower extremity edema, chest pain, palpitations.   REVIEW OF SYSTEMS: Difficult to obtain secondary to the patient's mental status and difficulty of hearing; however:  CONSTITUTIONAL: Denies any fevers, chills. Positive for fatigue, generalized weakness.  EYES: Denies blurry vision, double vision.  EARS, NOSE, THROAT: Denies tinnitus, ear pain. Positive for hearing loss which is old.  RESPIRATORY: Positive for cough that is nonproductive, as well as shortness of breath.  CARDIOVASCULAR: Denies chest pain, palpitations, edema.  GASTROINTESTINAL: Positive for diarrhea. Denies nausea, vomiting,  abdominal pain.  GENITOURINARY: Denies dysuria or hematuria.  ENDOCRINE: Denies nocturia or thyroid problems.  HEMATOLOGIC AND LYMPHATIC: Denies easy bruising or bleeding.  SKIN: Denies rash or lesions.  MUSCULOSKELETAL: Denies pain in the neck, back, shoulder, knees, hips or arthritic symptoms.  NEUROLOGIC: Denies paralysis, paresthesias.  PSYCHIATRIC: Denies anxiety or depressive symptoms.   Otherwise, full review of systems performed by me is negative.   PAST MEDICAL HISTORY: Atrial fibrillation previously on Xarelto therapy, though held back in November secondary to epistaxis requiring packing; hypertension; hyperlipidemia.   SOCIAL HISTORY: No alcohol, tobacco or drug usage. Resides with her daughter.   FAMILY HISTORY: Positive for hypertension.   ALLERGIES: PENICILLIN.   HOME MEDICATIONS: Include losartan 50 mg p.o. daily, Cardizem 180 mg p.o. daily, metoprolol 100 mg p.o. daily, Symbicort 160/4.5 mcg inhalation 2 puffs Snow.i.d.   PHYSICAL EXAMINATION:  VITAL SIGNS: Temperature 97.4, heart rate 104, respirations 26, blood pressure 143/71, saturating 85% on room air. Currently saturating 94% on 4 liters nasal cannula. Weight 75.8 kg, BMI 32.6.  GENERAL: Weak, frail-appearing, Caucasian female in minimal distress secondary to respiratory status.  HEAD: Normocephalic, atraumatic.  EYES: Pupils equal, round and reactive to light. Extraocular muscles intact. No scleral icterus.  MOUTH: Dry mucosal membranes. Dentition intact. No abscess noted.  EARS, NOSE, THROAT: Throat clear without exudates. No external lesions.  NECK: Supple. No thyromegaly. No nodules. No JVD.  PULMONARY: Greatly diminished breath sounds bibasilar; however, no noted rhonchi or crackles. Currently no use of accessory muscles. Poor respiratory effort.  CHEST: Nontender to palpation.  CARDIOVASCULAR: S1, S2, irregular rate, irregular rhythm. No murmurs, rubs or gallops. Trace pedal edema to the shins bilaterally.  Pedal pulses 2+ bilaterally.  GASTROINTESTINAL: Soft, nontender, nondistended. No masses. Positive bowel sounds. No hepatosplenomegaly.  MUSCULOSKELETAL: No swelling  or clubbing. Positive for edema as described above. Range of motion full in all extremities.  NEUROLOGIC: Cranial nerves II through XII intact. No gross focal neurological deficits. Sensation intact. Reflexes intact.  SKIN: No ulcerations, lesions, rash, cyanosis. Skin warm, dry. Turgor poor.  PSYCHIATRIC: Mood and affect within normal limits, though extremity difficult to obtain answers. She is awake and responsive. She is oriented x 3 after repeated questioning. Insight and judgment appear to be intact.   LABORATORY DATA: Chest x-ray performed revealing left lower lobe interstitial infiltrate with compressive atelectasis as well as small bilateral effusions. There is also noted pulmonary edema. Remainder of laboratory data: Sodium 137, potassium 4.3, chloride 107, bicarb 23, BUN 28, creatinine 1.06, glucose 96. BNP 6294. LFTs within normal limits. Troponin I less than 0.01. WBC 7.9, hemoglobin 10.1, platelets 217. INR of 1.3.   ASSESSMENT AND PLAN: An 81 year old Caucasian female with history of atrial fibrillation, presenting with diarrhea for about a 1 week duration; however, she was noted to be hypoxemic with shortness of breath progressively worsening over 1 week.  1. Sepsis: Meeting septic criteria by heart rate and respiratory rate. Possible source is community-acquired pneumonia versus gastrointestinal illness. She has been cultured with blood cultures. Antibiotic coverage with Levaquin. She has been given Levaquin and ceftriaxone in the Emergency Department. Continue Levaquin. Intravenous fluid hydration as required to keep mean arterial blood pressure greater than 65.  2. Hypoxemia: This is multifactorial with possible community-acquired pneumonia, atelectasis, as well as possible congestive heart failure. She has no documented  history of congestive heart failure. Transthoracic echocardiogram performed back in 2012 was within normal limits; however, there is pulmonary edema as noted on chest x-ray. Will provide Lasix dosing now. Follow ins and outs and daily weights. Provide supplemental oxygen to keep oxygen saturation greater than 92%. DuoNeb therapy q.4 hours and check a transthoracic echocardiogram.  3. Diarrhea: She has a Clostridium difficile pending. If negative, will continue with supportive care.  4. Atrial fibrillation: Rate controlled with Cardizem.  5. Venous thromboembolism prophylaxis with heparin subcutaneous.   The patient is FULL CODE.   TIME SPENT: 45 minutes.   ____________________________ Cletis Athens. Hower, MD dkh:gb D: 09/07/2013 22:03:42 ET T: 09/07/2013 23:42:27 ET JOB#: [id number]  cc: Cletis Athens. Hower, MD, <Dictator> DAVID Synetta Shadow MD ELECTRONICALLY SIGNED 09/08/2013 21:04

## 2014-11-17 NOTE — Discharge Summary (Signed)
PATIENT NAME:  Erika Snow, Erika Snow MR#:  [id number] DATE OF BIRTH:  08-Aug-1923  DATE OF ADMISSION:  04/16/2014 DATE OF DISCHARGE:  04/20/2014  PRIMARY CARE PHYSICIAN: Carmon Ginsberg with Dr. Venia Minks  FINAL DIAGNOSES: 1.  Altered mental status, unresponsive. 2.  Partial seizure. 3.  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 4.  Rapid atrial fibrillation. 5.  Urinary tract infection. 6.  Hyperlipidemia. 7.  Vomiting.   DISPOSITION: The patient will be discharged to the hospice home with diet as tolerated, activity as tolerated, oxygen and Foley.  DISCHARGE MEDICATIONS: Keppra 1000 mg twice a day, Roxanol 0.5 mL orally every 1 to 2 hours as needed for pain or shortness of breath, diazepam 2.5 mg rectal kit two each rectal every 8 hours as needed for seizures, Zofran ODT 1 tablet 4 times a day as needed for vomiting.   REASON FOR ADMISSION: The patient was admitted April 16, 2014 and discharged to the hospice home on April 20, 2014. The patient came in with left-sided tremor, acute encephalopathy. The patient was admitted for possible acute CVA versus new onset seizure. The patient was seen in consultation by Dr. Irish Elders. IV Keppra was initially started.   DIAGNOSTIC DATA: During the hospital course included: A urine culture that grew out E. coli, pan sensitive. Blood culture negative.   Urinalysis 3+ leukocyte esterase, 3+ blood.  Troponin borderline at 0.06. White blood cell count 16.5, H and H 11.6 and 37.2, and platelet count 310,000.   CT scan of the head: No acute intracranial pathology.  Chest x-ray: Mixed interstitial airspace disease and small bilateral pleural effusions suspicious for congestive heart failure.   Ultrasound of the carotids: Bilateral plaque formation without focal hemodynamically significant stenosis.   Echo showed EF of 50% to 55%, moderate mitral regurgitation.   Troponin borderline at 0.08. Troponin borderline at 0.12.    MRI of the brain with and without showed severe and chronically advanced small vessel ischemic change, progressed since 2009 with interval right MCA infarct. Superimposed acute gyriform signal abnormality along the margins of chronic right MCA encephalomalacia most compatible with sequelae of recent seizure activity in this setting.   EEG read by Dr. Tamala Julian on September 23rd showed abnormal EEG due to intermittent generalized slowing of the background.   HOSPITAL COURSE: Dr. Tamala Julian saw on the 23rd, thought this was partial seizures, started Vimpat along with the [place]. The patient continued to have a tremor during the entire hospital course. For the patient's acute encephalopathy, believed secondary to seizure, UTI, but the patient became unresponsive on the day of discharge, could not awake to sternal rub. The patient will be transferred to the hospice home. The patient also had vomiting. P.r.n. nausea medications. The patient does have a history of a large CVA with left-sided weakness. The patient does have atrial fibrillation, rapid, with unable to tolerate the medications with the vomiting, also hyperlipidemia. The case was discussed with the palliative care team, Dr. Ermalinda Memos, who discussed with the son. The patient will be transferred to the hospice home today.   TIME SPENT ON DISCHARGE: Greater than 30 minutes.  ____________________________ Tana Conch. Leslye Peer, MD rjw:sb D: 04/20/2014 13:18:17 ET T: 04/20/2014 13:41:44 ET JOB#: [id number]  cc: Tana Conch. Leslye Peer, MD, <Dictator> Margarita Rana, MD / Rose Fillers. [place], [place] Marisue Brooklyn MD ELECTRONICALLY SIGNED 05/13/2014 12:46

## 2014-11-17 NOTE — Consult Note (Signed)
PATIENT NAME:  Erika Snow, Erika Snow MR#:  [id number] DATE OF BIRTH:  11/02/1923  DATE OF CONSULTATION:  04/16/2014  CONSULTING PHYSICIAN:  Pauletta BrownsYuriy Chani Ghanem, MD  HISTORY OF PRESENT ILLNESS:   This is an 81 year old female with past medical history of stroke with a residual right-sided that occurred in June 2014 with a residual left-sided weakness. The patient has history of atrial fibrillation and was on Xarelto, which was discontinued. Presenting with altered mental status and found to have shaking spells on the left side with gaze deviation towards the left. Imaging reviewed. The patient has right frontal chronic infarct with encephalomalacia. The patient was also found to have a urinary tract infection and was started on Levaquin.   PAST MEDICAL HISTORY: Of pneumonia, chronic diastolic congestive heart failure, severe mitral cuspid regurgitation, right frontal lobe infarct with encephalomalacia.   ALLERGIES: INCLUDE PENICILLIN.   CURRENT MEDICATIONS FROM HOME: Include DuoNebs, aspirin 81, Ativan, ipratropium, Lipitor, losartan, MiraLax, metoprolol, K-Dur, Prevacid, Tylenol.   FAMILY HISTORY: Positive for hypertension.   SOCIAL HISTORY: Lives in [facility].   REVIEW OF SYSTEMS: Unable to obtain at this point. The patient appears to be sedated.   LABORATORY DATA: Work-up has been reviewed. Positive for a urinary tract infection.    PHYSICAL EXAMINATION: VITAL SIGNS: A temperature of 98.4, pulse 65, respirations 17, blood pressure of 147/62, pulse oximetry is 100%.  NEUROLOGIC: The patient appears to be somnolent, disconjugate gaze, withdraws from painful stimuli bilaterally upper and lower extremities. Responds to visual threats.   IMPRESSION: An 81 year old female with history of right frontal stroke, left-sided hemiplegia that improved over time, admitted with altered mental status, found to have apparent seizure on the left side with a left gaze deviation. The patient was found to have  a urinary tract infection and was started on Levaquin.   PLAN: I agree with continuation of Keppra. Currently on Keppra 500 mg q. 12 hours. Would obtain EEG tomorrow as the patient does appear to be somnolent. I agree with code status DNR  as per family wishes.  Urinary tract infection. The patient was started on Levaquin.  Will discontinue Levaquin, which lowers the seizure threshold. Unaware of what the ALLERGY TO PENICILLIN is. At this point, we will start on another antibiotic for a urinary tract infection. The patient is currently getting an echocardiogram. Would obtain carotid Dopplers, but based on presentation, I do not think this is a new infarct. The eyes were looking towards the left, which is away from the chronic stroke, and the patient has encephalomalacia, which is cortical involvement of the old stroke. Physical therapy and occupational therapy.   Thank you. It was a pleasure seeing this patient. Please call with any questions.   ____________________________ Pauletta BrownsYuriy Saahas Hidrogo, MD yz:lr D: 04/16/2014 16:12:48 ET T: 04/16/2014 19:11:44 ET JOB#: [id number]  cc: Pauletta BrownsYuriy Lekeisha Arenas, MD, <Dictator> Pauletta BrownsYURIY Deshan Hemmelgarn MD ELECTRONICALLY SIGNED 04/30/2014 14:26

## 2014-11-17 NOTE — H&P (Signed)
PATIENT NAME:  Erika Snow, Erika Snow MR#:  [id number] DATE OF BIRTH:  05-15-1924  DATE OF ADMISSION:  04/16/2014  CHIEF COMPLAINT: Altered mental status/unresponsiveness.   History is obtained from the patient's son who is present in the Emergency Room. The patient is currently sedated with IV Ativan, which was given in the Emergency Room. She is unable to give any history or review of systems.   HISTORY OF PRESENT ILLNESS: Erika Snow is an 81 year old Caucasian female with history of right-sided CVA in June of 2014 which caused her to have complete left-sided hemiplegia, went to rehab at [facility], and the patient's left-sided weakness improved slowly. She currently uses an [employer] motorized wheelchair to get around at [facility]. She has history of A-fib, was on Xarelto, however now on aspirin, comes to the Emergency Room after she was found unresponsive by staff at [facility] in early hours of the morning. She was also having a shaking spell on the left side along with left side gaze which was noted in the Emergency Room when she arrived. The patient currently during my evaluation is sedated and is unable to give any review of systems or history. She is being admitted for possible new stroke versus new onset seizures.   PAST MEDICAL HISTORY: 1.  Pneumonia.  2.  Chronic diastolic congestive heart failure.  3.  Severe mitral and tricuspid regurgitation by echo. 4.  Chronic atrial fibrillation.  5.  Right frontal lobe infarct with left-sided hemiplegia/hemiparesis in June of 2014 when [facility] was [facility], thereafter transferred [facility] and hence since then completed skilled rehab and now on the long term side of [facility].   ALLERGIES: PENICILLIN.   CURRENT MEDICATIONS: 1.  DuoNebs as needed.  2.  Aspirin 81 mg daily.  3.  Ativan 0.5 mg every 8 hours as needed.  4.  Cardizem 90 mg 1/2 tablet every 6 hours.  5.  Celexa 20 mg p.o. daily.  6.  Ipratropium  inhalation every 6 hours as needed.  7.  Lidocaine topical apply 1 patch topically to left shoulder and left wrist once a day.  8.  Lipitor 40 mg p.o. daily.  9.  Losartan 50 mg daily.  10.  Maalox 30 mL every 6 hours as needed.  11.  Metoprolol 50 mg 1 tablet Snow.i.d.  12.  K-Dur 20 mEq p.o. daily.  13.  Prevacid 30 mg p.o. daily.  14.  Tylenol PM 500/25 1 p.o. daily at bedtime.   FAMILY HISTORY: From old records, positive for hypertension.   SOCIAL HISTORY: She is a resident at [facility]. No history of alcohol, drug or tobacco abuse in the past.   REVIEW OF SYSTEMS: Unobtainable. The patient is sedated from IV Ativan given to her initially.   PHYSICAL EXAMINATION: Again, limited secondary to the patient being sedated.  VITAL SIGNS: Temperature 99.7, pulse 69, blood pressure 165/76, pulse ox 95% on 2 liters nasal cannula.  HEENT: Atraumatic, normocephalic. Pupils are equal, round and reactive to light and accommodation. There is a mild left-sided gaze. No nystagmus noted.  NECK: Supple. No JVD. No carotid bruit.  LUNGS: Clear to auscultation bilaterally. No rales, rhonchi, respiratory distress or labored breathing.  HEART: Both heart sounds are normal. Rhythm is regular at this time. No murmur heard. PMI not lateralized.  EXTREMITIES: Good pedal pulses. Good femoral pulses. No lower extremity edema.  ABDOMEN: Soft, benign, nontender. No organomegaly. The patient does have a PEG tube present. Skin around  the PEG tube looks normal.  NEUROLOGIC: Again, very limited secondary to the patient being sedated. She has no nystagmus. Pupils are bilaterally reacting to light. Minimal left-sided gaze. Unable to examine motor, sensory or cerebellar secondary to the patient being sedated. Her plantars are downgoing. Deep tendon jerks are 1+ in both upper and lower extremities.  SKIN: Warm and dry.   DIAGNOSTIC DATA: CT of the head shows no acute intracranial pathology. Old right frontal lobe infarct  with encephalomalacia noted.   Chest x-ray: Mild interstitial airspace disease with small pleural effusions.   Troponin is 0.06. CPK and MB were within normal limits. Chloride 110, bicarb 20, calcium 7.1, protein 5.8. Creatinine 0.56, BUN 14, glucose 93. CBC within normal limits, except white count of 16.5, H and H 11.6 and 37.2. UA positive for UTI.  ASSESSMENT AND PLAN: An 81 year old, Ms. Erika Snow, with history of chronic atrial fibrillation and history of right-sided cerebrovascular accident with left-sided hemiparesis who comes to the Emergency Room with increasing dysarthria and altered mental status. She is being admitted with:  1.  Altered mental status/acute encephalopathy along with shaking spell, mainly noted on the left side, left upper and lower extremities by staff at [facility], likely due to possible acute cerebrovascular accident versus new onset seizures. The patient's CT head is negative for any bleed or new cerebrovascular accident. At this time, we will admit the patient on telemetry floor, n.p.o., continue IV fluids for hydration, will obtain MRI of the brain in the morning, EEG and neurology consultation, neuro checks, and continue aspirin per rectal at this time.  2.  Possible seizures. Given the patient came in with altered mental status and left-sided shaking spell noted by staff at [facility], we will load her with IV Keppra and give her IV 500 Snow.i.d. Keppra. EEG in the morning. Neurology consultation has been placed. Seizure precautions.  3.  Chronic atrial fibrillation. The patient has chronic atrial fibrillation. Her heart rate is stable at this time. Avoid any antihypertensives or rate blocking agents in order to allow permissive hypertension in case this is new cerebrovascular accident. Will give p.r.n. metoprolol if needed, IV, for heart rate control.  4.  Urinary tract infection. We will check blood cultures and the patient has received IV Levaquin. We will  continue for now and send urine culture.  5.  Deep vein thrombosis prophylaxis. Subcutaneous heparin t.i.d.  6.  Care management for discharge planning.   CODE STATUS: Discussed with the patient's son who was present in the Emergency Room. The patient's son requests DNR as per the patient's wishes. He will consider palliative care consultation and discussion if the patient's condition does not improve or continues to deteriorate.   The above was discussed with the patient's son in the Emergency Room. Son does understand critical nature of illness.   TIME SPENT: 55 minutes.    ____________________________ Wylie Hail Allena Katz, MD sap:sb D: 04/16/2014 11:20:36 ET T: 04/16/2014 11:55:07 ET JOB#: [id number]  cc: Mala Gibbard A. Allena Katz, MD, <Dictator> Leo Grosser, MD Willow Ora MD ELECTRONICALLY SIGNED 04/19/2014 15:22

## 2014-11-17 NOTE — Discharge Summary (Signed)
PATIENT NAME:  Erika Snow, Erika Snow MR#:  [id number] DATE OF BIRTH:  Apr 17, 1924  DATE OF ADMISSION:  09/07/2013 DATE OF DISCHARGE:  09/11/2013  ADMITTING PHYSICIAN:  Aaron Mose. Hower, MD  DISCHARGING PHYSICIAN: Gladstone Lighter, MD  PRIMARY CARE PHYSICIAN: Valetta Close, MD  [place]: None.   DISCHARGE DIAGNOSES: 1.  Sepsis. 2.  Viral gastroenteritis.  3.  Acute respiratory failure.  4.  Pneumonia.  5.  Acute diastolic congestive heart failure exacerbation.  6.  Severe mitral and tricuspid regurgitation.  7.  Atrial fibrillation.   DISCHARGE HOME MEDICATIONS: 1.  Losartan 50 mg p.o. daily.  2.  Toprol 100 mg p.o. daily.  3.  Symbicort 160/4.5 mcg 2 puffs b.i.d.  4.  Cardizem 180 mg p.o. daily.  5.  Lasix 20 mg p.o. daily.  6.  Potassium chloride 10 mEq p.o. daily.  7.  Levaquin 500 mg p.o. daily for 3 days.  8.  Aspirin 81 mg p.o. daily.    DISCHARGE DIET: Low sodium diet.   DISCHARGE ACTIVITY: As tolerated.    FOLLOWUP INSTRUCTIONS:  1.  PCP followup in 1 to 2 weeks.   2.  Home health, physical therapy and nursing.  3.  Advised to follow a strict low sodium diet.  LABORATORY, DIAGNOSTIC AND RADIOLOGICAL DATA PRIOR TO DISCHARGE:  1.  Sodium 137, potassium 3.2, chloride 104, bicarb 26, BUN 19, creatinine 1.29, glucose 98 and calcium of 8.5.  2.  WBC 6.5, hemoglobin 9.7, hematocrit 30.2, platelet count 200.  3.  ALT 13, AST 18, alk phos 65, total bili 0.6 and albumin 3.0.  4.  Urine cultures negative.  5.  Stool for C. difficile is negative.  6.  Echo Doppler showing EF of 50% to 55%, dilated left and right atrium with moderate left pleural effusion, severe mitral regurgitation and also severe tricuspid regurgitation.  7.  Urinalysis was done, nitrite and leuk esterase, positive with 2+ bacteria and several WBCs.  8.  Blood cultures remain negative.  9.  Chest x-ray on admission showing mild congestive heart failure, left lung with pneumonia and  bilateral pleural effusions. Repeat chest x-ray while in the hospital showed worsening pulmonary edema and chest x-ray prior to discharge showing improvement of congestive heart failure and pulmonary edema, mild to moderate residual left pleural effusion is present.   BRIEF HOSPITAL COURSE: Erika Snow is a pleasant 81 year old Caucasian female with past medical history significant for atrial fibrillation, hypertension and hyperlipidemia, who presents to the hospital secondary to worsening diarrhea.  1.  Sepsis secondary to pneumonia and viral gastroenteritis. Her diarrhea improved once she was in the hospital without any antibiotic therapy.  For her pneumonia, she was started on Levaquin with significant improvement. Blood cultures remain negative.  2.  Acute respiratory failure secondary to pneumonia and acute diastolic CHF exacerbation. Pneumonia as improving with antibiotics, however, the patient was dyspneic and was on 4 liters of oxygen at one point in the hospital. Chest x-ray showed worsening pulmonary edema so her fluids were stopped and she was started on Lasix therapy. She has improved significantly and is currently on room air on ambulation and was saturating around 91% to 92%. She is being discharged on low-dose Lasix for the next 2 weeks and will need to be followed up by her PCP before starting back on Lasix. She also had significant valvular disease including mitral and tricuspid regurgitation based on the echocardiogram.  Her EF is 60%.  She is already on aspirin,  losartan and Toprol.  3.  Atrial fibrillation. The patient was on Xarelto at some point but due to bleeding risk it was stopped. She is currently only on low-dose aspirin which she can continue.  Her course has been otherwise uneventful in hospital. The patient worked well and physical therapy recommended home, however, she could benefit from home health nursing and PT and is being discharged on the same. Her course has been otherwise  uneventful in the hospital.   DISCHARGE CONDITION: Stable.   DISCHARGE DISPOSITION: Home with home health.   TIME SPENT ON DISCHARGE: 40 minutes.   ____________________________ Gladstone Lighter, MD rk:cs D: 09/11/2013 13:54:00 ET T: 09/11/2013 14:51:19 ET JOB#: [id number]  cc: Gladstone Lighter, MD, <Dictator> Valetta Close, MD Gladstone Lighter MD ELECTRONICALLY SIGNED 09/12/2013 15:07
